# Patient Record
Sex: Male | Born: 1945 | Race: Black or African American | Hispanic: No
Health system: Southern US, Community
[De-identification: ages and names within clinical notes are randomized; demographics above are authoritative.]

---

## 2004-01-21 ENCOUNTER — Encounter: Admission: RE | Admit: 2004-01-21 | Discharge: 2004-01-21 | Payer: Self-pay | Admitting: Family Medicine

## 2004-04-01 ENCOUNTER — Ambulatory Visit (HOSPITAL_COMMUNITY): Admission: RE | Admit: 2004-04-01 | Discharge: 2004-04-01 | Payer: Self-pay | Admitting: Gastroenterology

## 2004-04-01 ENCOUNTER — Encounter (INDEPENDENT_AMBULATORY_CARE_PROVIDER_SITE_OTHER): Payer: Self-pay | Admitting: *Deleted

## 2004-04-13 ENCOUNTER — Encounter: Admission: RE | Admit: 2004-04-13 | Discharge: 2004-04-13 | Payer: Self-pay | Admitting: Nephrology

## 2004-06-08 ENCOUNTER — Ambulatory Visit: Payer: Self-pay | Admitting: Gastroenterology

## 2004-07-07 ENCOUNTER — Ambulatory Visit (HOSPITAL_COMMUNITY): Admission: RE | Admit: 2004-07-07 | Discharge: 2004-07-07 | Payer: Self-pay | Admitting: Gastroenterology

## 2004-07-07 ENCOUNTER — Encounter (INDEPENDENT_AMBULATORY_CARE_PROVIDER_SITE_OTHER): Payer: Self-pay | Admitting: Specialist

## 2004-08-05 ENCOUNTER — Ambulatory Visit: Payer: Self-pay | Admitting: Internal Medicine

## 2004-10-11 ENCOUNTER — Ambulatory Visit: Payer: Self-pay | Admitting: Gastroenterology

## 2005-02-03 ENCOUNTER — Ambulatory Visit: Payer: Self-pay | Admitting: Gastroenterology

## 2005-02-10 ENCOUNTER — Ambulatory Visit: Payer: Self-pay | Admitting: Internal Medicine

## 2005-02-15 ENCOUNTER — Ambulatory Visit: Payer: Self-pay | Admitting: Gastroenterology

## 2005-03-24 ENCOUNTER — Ambulatory Visit: Payer: Self-pay | Admitting: Gastroenterology

## 2005-04-21 ENCOUNTER — Ambulatory Visit: Payer: Self-pay | Admitting: Gastroenterology

## 2005-05-05 ENCOUNTER — Ambulatory Visit: Payer: Self-pay | Admitting: Gastroenterology

## 2005-06-09 ENCOUNTER — Ambulatory Visit: Payer: Self-pay | Admitting: Gastroenterology

## 2005-06-23 ENCOUNTER — Ambulatory Visit: Payer: Self-pay | Admitting: Gastroenterology

## 2005-09-23 ENCOUNTER — Ambulatory Visit: Payer: Self-pay | Admitting: Gastroenterology

## 2005-11-08 ENCOUNTER — Encounter: Admission: RE | Admit: 2005-11-08 | Discharge: 2005-11-08 | Payer: Self-pay | Admitting: Urology

## 2008-04-08 ENCOUNTER — Inpatient Hospital Stay (HOSPITAL_COMMUNITY): Admission: EM | Admit: 2008-04-08 | Discharge: 2008-04-12 | Payer: Self-pay | Admitting: Emergency Medicine

## 2008-04-09 ENCOUNTER — Ambulatory Visit: Payer: Self-pay | Admitting: Vascular Surgery

## 2008-04-09 ENCOUNTER — Encounter (INDEPENDENT_AMBULATORY_CARE_PROVIDER_SITE_OTHER): Payer: Self-pay | Admitting: Internal Medicine

## 2008-04-18 ENCOUNTER — Encounter: Admission: RE | Admit: 2008-04-18 | Discharge: 2008-04-18 | Payer: Self-pay | Admitting: Orthopaedic Surgery

## 2008-07-14 ENCOUNTER — Emergency Department (HOSPITAL_COMMUNITY): Admission: EM | Admit: 2008-07-14 | Discharge: 2008-07-15 | Payer: Self-pay | Admitting: Emergency Medicine

## 2008-07-15 ENCOUNTER — Encounter (INDEPENDENT_AMBULATORY_CARE_PROVIDER_SITE_OTHER): Payer: Self-pay | Admitting: Emergency Medicine

## 2008-07-15 ENCOUNTER — Ambulatory Visit: Payer: Self-pay | Admitting: Surgery

## 2008-07-15 ENCOUNTER — Ambulatory Visit (HOSPITAL_COMMUNITY): Admission: RE | Admit: 2008-07-15 | Discharge: 2008-07-15 | Payer: Self-pay | Admitting: Emergency Medicine

## 2009-08-16 IMAGING — CT CT EXTREM LOW W/O CM*L*
2 of 4 series · 6 of 14 positions shown, 7 images · non-contrast
Comparison: Lower leg radiographs 04/08/2008 and whole body bone
scan 11/08/2005.

CLINICAL DATA: 64-year-old with recent hospitalization for
intravenous antibiotics.  Question deep soft tissue abscess.
History of remote injury and surgery in 6899.

CT OF THE LEFT LOWER LEG WITHOUT CONTRAST
TECHNIQUE: Multidetector CT imaging of the left lower leg was
performed according to the standard protocol without intravenous
contrast. Multiplanar CT image reconstructions were also generated.

[Series 2: left lower leg /bone · axial · 0.39mm/px · z∈[-418,-171]mm · 3 of 199 slices shown, 4 images]
[im 50/199  soft-tissue]
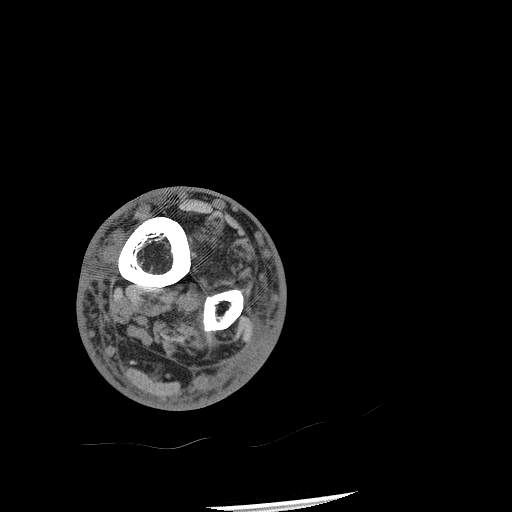
[im 50/199  bone]
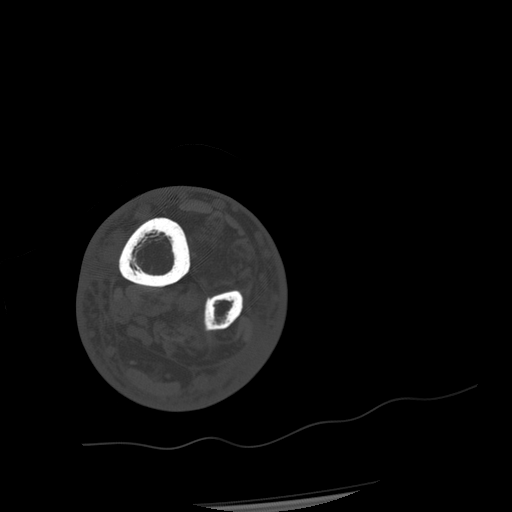
[im 100/199  bone]
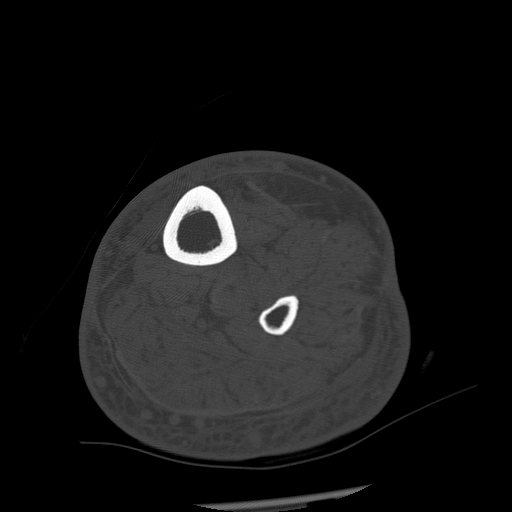
[im 149/199  bone]
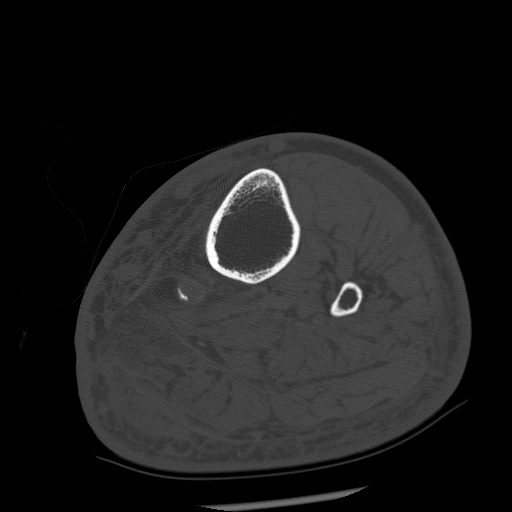

[Series 3: left lower leg/detail · axial · 0.47mm/px · z∈[-416,-171]mm · 3 of 198 slices shown]
[im 50/198  bone]
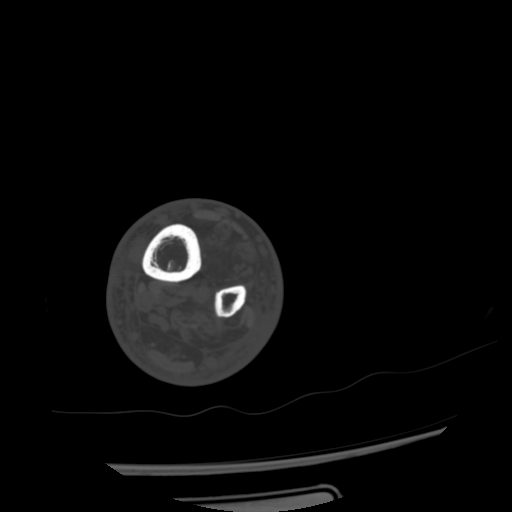
[im 99/198  bone]
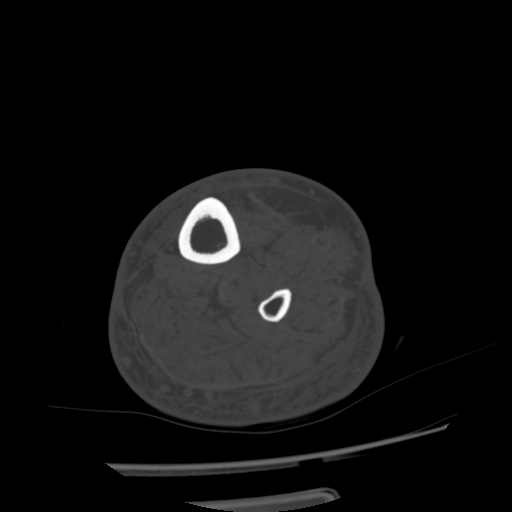
[im 148/198  bone]
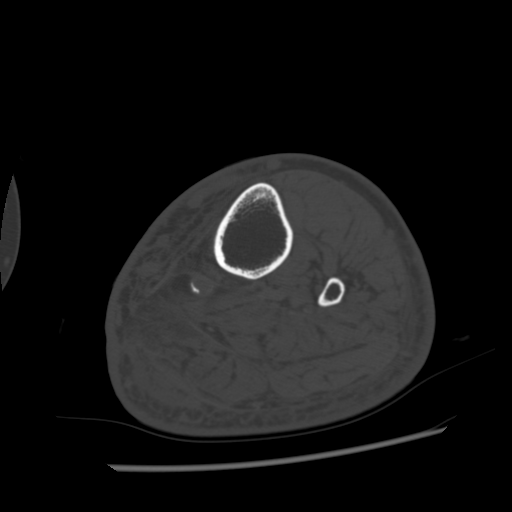

[6 of 14 positions shown; findings below may reference images not displayed]

FINDINGS: The study was ordered and performed without intravenous
contrast.  There is diffuse subcutaneous edema in the left lower
leg compatible cellulitis.  No focal fluid collection is evident.
There is diffuse muscular atrophy.  There are diffuse vascular
calcifications with scattered vascular surgical clips.  No other
foreign bodies are identified. There is no soft tissue emphysema.

There are post-traumatic deformities of the distal tibial and
fibular diaphyses.  Extensive degenerative changes are present at
the knee status post arthrodesis.  There is solid osseous fusion
between the distal femur, patella, proximal tibia and proximal
fibula.  At the ankle, there are degenerative changes with
extensive fragmentation of the calcaneal tuberosity at the Achilles
insertion.  Based on the reformatted images and prior plain films,
this does not appear to be acute, however, clinical correlation is
necessary.  The distal Achilles tendon does appear mildly
thickened, and there is mild surrounding soft tissue edema.
IMPRESSION: 1.  No evidence of deep soft tissue abscess in the left lower leg
on noncontrast CT.  If further evaluation is warranted clinically,
MRI with contrast may be helpful.
2.  Diffuse soft tissue edema most compatible with cellulitis.
3.  No evidence of foreign body or soft tissue gas.
4.  Extensive post-traumatic and postsurgical changes as described
with solid arthrodesis at the knee and a presumed old nonunited
fracture of the calcaneal tuberosity.  Correlate clinically.

## 2010-10-25 LAB — URINALYSIS, ROUTINE W REFLEX MICROSCOPIC
Bilirubin Urine: NEGATIVE
Glucose, UA: 100 mg/dL — AB
Ketones, ur: NEGATIVE mg/dL
Leukocytes, UA: NEGATIVE
Nitrite: NEGATIVE
Protein, ur: 100 mg/dL — AB
Specific Gravity, Urine: 1.022 (ref 1.005–1.030)
Urobilinogen, UA: 1 mg/dL (ref 0.0–1.0)
pH: 5.5 (ref 5.0–8.0)

## 2010-10-25 LAB — DIFFERENTIAL
Basophils Absolute: 0 10*3/uL (ref 0.0–0.1)
Basophils Relative: 1 % (ref 0–1)
Eosinophils Absolute: 0.2 10*3/uL (ref 0.0–0.7)
Eosinophils Relative: 2 % (ref 0–5)
Lymphocytes Relative: 32 % (ref 12–46)
Lymphs Abs: 2.3 10*3/uL (ref 0.7–4.0)
Monocytes Absolute: 0.6 10*3/uL (ref 0.1–1.0)
Monocytes Relative: 9 % (ref 3–12)
Neutro Abs: 4.2 10*3/uL (ref 1.7–7.7)
Neutrophils Relative %: 57 % (ref 43–77)

## 2010-10-25 LAB — COMPREHENSIVE METABOLIC PANEL
ALT: 23 U/L (ref 0–53)
AST: 31 U/L (ref 0–37)
Albumin: 3.3 g/dL — ABNORMAL LOW (ref 3.5–5.2)
Alkaline Phosphatase: 51 U/L (ref 39–117)
BUN: 65 mg/dL — ABNORMAL HIGH (ref 6–23)
CO2: 16 mEq/L — ABNORMAL LOW (ref 19–32)
Calcium: 8.7 mg/dL (ref 8.4–10.5)
Chloride: 110 mEq/L (ref 96–112)
Creatinine, Ser: 4.28 mg/dL — ABNORMAL HIGH (ref 0.4–1.5)
GFR calc Af Amer: 17 mL/min — ABNORMAL LOW (ref >60)
GFR calc non Af Amer: 14 mL/min — ABNORMAL LOW (ref >60)
Glucose, Bld: 125 mg/dL — ABNORMAL HIGH (ref 70–99)
Potassium: 4.8 mEq/L (ref 3.5–5.1)
Sodium: 135 mEq/L (ref 135–145)
Total Bilirubin: 0.3 mg/dL (ref 0.3–1.2)
Total Protein: 6.3 g/dL (ref 6.0–8.3)

## 2010-10-25 LAB — CBC
HCT: 29 % — ABNORMAL LOW (ref 39.0–52.0)
Hemoglobin: 9.8 g/dL — ABNORMAL LOW (ref 13.0–17.0)
MCHC: 33.6 g/dL (ref 30.0–36.0)
MCV: 92.8 fL (ref 78.0–100.0)
Platelets: 235 10*3/uL (ref 150–400)
RBC: 3.13 MIL/uL — ABNORMAL LOW (ref 4.22–5.81)
RDW: 13.5 % (ref 11.5–15.5)
WBC: 7.3 10*3/uL (ref 4.0–10.5)

## 2010-10-25 LAB — URINE MICROSCOPIC-ADD ON

## 2010-10-25 LAB — BRAIN NATRIURETIC PEPTIDE: Pro B Natriuretic peptide (BNP): <30 pg/mL (ref 0.0–100.0)

## 2010-11-23 NOTE — H&P (Signed)
NAME:  Eric Walter, Eric Walter              ACCOUNT NO.:  [id number]   MEDICAL RECORD NO.:  [id number]          PATIENT TYPE:  EMS   LOCATION:  MAJO                         FACILITY:  [facility]   PHYSICIAN:  Ramiro Harvest, MD    DATE OF BIRTH:  09/22/1943   DATE OF ADMISSION:  04/08/2008  DATE OF DISCHARGE:                              HISTORY & PHYSICAL   PRIMARY CARE PHYSICIAN:  Caryn Bee L. Little, M.D. of [facility].   NEPHROLOGIST:  Mindi Slicker. Lowell Guitar, M.D. of [facility].   HISTORY OF PRESENT ILLNESS:  Eric Walter is a 65 year old African  American male with history of chronic renal insufficiency, chronic  hepatitis, status post left knee and ankle fusion secondary to an injury  in the 1980s, who stated that he fell 2 months ago off a ladder and had  a chip wound in his heel for which he saw a Dr. Merleen Milliner for and which  got better.  The patient states that 3 days prior to admission he  started experiencing left calf pain, which has been worsening since  then.  One day prior to admission the patient noted on his left lower  extremity from the knee to the heel he had some edema, erythema, warmth  and also worsening pain on standing.  The patient endorses some  subjective fevers. no chills, no nausea or vomiting, no shortness of  breath, no chest pain, no abdominal pain, no diarrhea, no constipation,  no cough, no melena, no hematemesis, no hematochezia.  No focal  neurological symptoms.  The patient denies any recent long car rides or  recent surgeries.  The patient took some Tylenol with some relief.  The  patient thus presented to the ED.  In the ED the patient was noted to  have a white count of 22.3, hemoglobin of 13.4, platelets of 236,  hematocrit of 40.9.  The patient was afebrile.  We were called to admit  the patient for further evaluation and recommendations.   ALLERGIES:  No known drug allergies.   PAST MEDICAL HISTORY:  1. Glaucoma.  2. Hypothyroidism.  3. Chronic renal insufficiency.  4. Chronic hepatitis with increased fibrosis per biopsy of December      2005.  5. Diverticulosis.  6. Colonic polyps status post polypectomy September of 2005.      Pathology revealed a tubular adenoma, adenomatous polyp and      tubulovillous adenoma with no high-grade dysplasia or malignancy.  7. Prostate cancer.  8. History of left ankle and knee fusion secondary to injury in the      1980s.  9. Status post heel spur surgery.   HOME MEDICATIONS:  1. Lisinopril 10 mg daily.  2. Levothyroxine 100 mcg daily.  3. Travatan 0.004% 1 drop to both eyes nightly.   SOCIAL HISTORY:  The patient is married, lives in [place].  Has a  remote tobacco history, quit in 1998.  Currently no tobacco use.  No  alcohol use.  No IV drug use.  The patient had a 4 year old daughter  who died secondary to an aneurysm.  Has one daughter who  is alive and  healthy and 1 grandson who is alive and healthy.   FAMILY HISTORY:  Father deceased, unknown.  Mother deceased secondary to  a motor vehicle accident.  Has one brother alive at age 53 with  hyperlipidemia.   REVIEW OF SYSTEMS:  As per HPI, otherwise negative.   PHYSICAL EXAM:  Temperature 98.1, blood pressure 105/65, pulse of 84,  respiratory rate 20, satting 97% on room air.  GENERAL: The patient is lying on the gurney in no apparent distress.  HEENT: Normocephalic, atraumatic.  Pupils equal, round and reactive to  light and accommodation.  Extraocular movements intact.  Oropharynx is  clear.  No lesions, no exudates.  NECK: Supple.  No lymphadenopathy.  RESPIRATORY:  Lungs are clear to auscultation bilaterally.  No wheezes,  no crackles, no rhonchi.  CARDIOVASCULAR:  Tachycardiac, regular rhythm.  No murmurs, rubs or  gallops.  ABDOMEN: Soft, nontender, nondistended.  Positive bowel sounds.  No  rebound, no guarding.  EXTREMITIES: No clubbing or cyanosis.  Left lower extremity with  erythema, 2+ edema.   Warmth from the need to the ankle.  Tenderness to  palpation in the calf region.  Negative Homans' sign and no crepitus.  NEUROLOGICAL:  The patient is alert and oriented x3.  Cranial nerves II-  XII are grossly intact.  No focal deficits.   LABORATORIES:  CBC:  White count 22.3, hemoglobin 13.4, platelets 236,  hematocrit 40.9, ANC of 17.3, sodium 137, potassium 4.9, chloride 105,  bicarb 23, BUN 36, creatinine 3.30, a glucose of 136 and a calcium of  9.4.   ASSESSMENT AND PLAN:  Brek Reece is a 65 year old gentleman with  history of chronic renal insufficiency, hypothyroidism, history of left  ankle and left knee fusion secondary to an injury in the 1980s who  presents to the emergency department with left lower extremity edema,  erythema, warmth and pain.  1. Left lower extremity edema/erythema/warmth/pain.  Differential      includes a cellulitis versus a deep vein thrombosis.  We will admit      the patient to a regular bed.  Check a hepatic panel.  Check blood      cultures x2.  Check a UA with cultures and sensitivities.  Check a      Doppler ultrasound of the left lower extremity to rule out deep      vein thrombosis.  Elevate left lower extremity.  Treat empirically      with IV vancomycin and full dose heparin while awaiting Dopplers,      monitor.  2. Chronic renal insufficiency, baseline is unknown.  We will have to      check from his primary care physician on the patient's baseline.      We will check a urinalysis, check a fractional excretion of sodium.      Hydrate gently and follow.  We will hold ACE inhibitor, monitor.  3. Hypothyroidism.  Check a TSH.  Continue levothyroxine 100 mcg p.o.      daily.  4. Glaucoma.  Travatan eye drops.  5. Chronic hepatitis with fibrosis.  6. Diverticulosis.  7. History of prostate cancer.  8. History of left knee and ankle fusion.  9. Colonic polyps status post polypectomy.  10.Prophylaxis:  Protonix for gastrointestinal  prophylaxis.  Heparin      for deep vein thrombosis prophylaxis.   It has been a pleasure taking care of Mr. Deckard Stuber.      Ramiro Harvest, MD  Electronically  Signed     DT/MEDQ  D:  04/08/2008  T:  04/08/2008  Job:  [id number]   cc:   Caryn Bee L. Little, M.D.  Mindi Slicker. Lowell Guitar, M.D.

## 2010-11-26 NOTE — Op Note (Signed)
NAME:  Eric Walter, Eric Walter              ACCOUNT NO.:  [id number]   MEDICAL RECORD NO.:  [id number]          PATIENT TYPE:  AMB   LOCATION:  ENDO                         FACILITY:  [facility]   PHYSICIAN:  Graylin Shiver, M.D.   DATE OF BIRTH:  09/22/1943   DATE OF PROCEDURE:  04/01/2004  DATE OF DISCHARGE:                                 OPERATIVE REPORT   PROCEDURE:  Colonoscopy with polypectomy and biopsy.   INDICATIONS FOR PROCEDURE:  Screening.  Informed consent was obtained after  explanation of the risks of bleeding, infection, and perforation.   PREMEDICATION:  Fentanyl 75 mcg IV, Versed 5 mg IV.   DESCRIPTION OF PROCEDURE:  With the patient in the left lateral decubitus  position, a rectal examination was performed.  No masses were felt.  The  Olympus colonoscope was inserted into the rectum and advanced around the  colon to the cecum.  Upon advancement of the scope, I did see an 8 mm  pedunculated polyp at the splenic flexure area.  This was snared and removed  by snare cautery technique.  The cautery site looked good and the polyp was  retrieved.  In the cecum, there was a fold or raised area that was palpated  and I suspect this is a lipoma.  This was biopsied.  The cecal landmarks  were identified.  The ascending colon showed a small 3 mm polyp biopsied off  with cold forceps.  The transverse colon looked normal.  The descending  colon and sigmoid did reveal diverticulosis.  Also in the descending colon,  there was a 5 mm sessile polyp snared and removed by snare cautery  technique.  The rectum was normal.  He tolerated the procedure well without  complications.   IMPRESSION:  1.  Polyp at the area of the splenic flexure, removed.  2.  Cecal fold versus lipoma.  This was biopsied to make sure that it is not      a polyp.  3.  Ascending colon polyp.  4.  Descending colon polyp.  5.  Diverticulosis of the left colon.   PLAN:  The pathology will be checked.      SFG/MEDQ   D:  04/01/2004  T:  04/02/2004  Job:  [id number]   cc:   Caryn Bee L. Little, M.D.  [address]  [place]  [place] [address]  Fax: [phone]

## 2010-11-26 NOTE — Discharge Summary (Signed)
NAME:  Eric Walter, Eric Walter              ACCOUNT NO.:  [id number]   MEDICAL RECORD NO.:  [id number]          PATIENT TYPE:  INP   LOCATION:  5128                         FACILITY:  [facility]   PHYSICIAN:  Corinna L. Lendell Caprice, MDDATE OF BIRTH:  09/22/1943   DATE OF ADMISSION:  04/08/2008  DATE OF DISCHARGE:  04/12/2008                               DISCHARGE SUMMARY   DISCHARGE DIAGNOSES:  1. Cellulitis of the left leg.  2. Chronic kidney disease.  3. Hypothyroidism.  4. Glaucoma.  5. History of hepatitis.  6. History of prostate cancer.  7. History of left knee and ankle fusion.  8. Diet-controlled diabetes.   DISCHARGE MEDICATIONS:  1. Augmentin 500 mg p.o. b.i.d. until gone.  2. Doxycycline 100 mg p.o. b.i.d. until gone.  3. Dilaudid 2 mg every 4 hours as needed for pain.  4. Continue lisinopril 10 mg a day.  5. Synthroid 100 mcg a day.  6. Travatan ophthalmic drops to both eyes nightly.  7. Aspirin 81 mg a day.   CONDITION:  Stable.   ACTIVITY:  Keep left leg elevated.   Diet should be diabetic.   Follow up with Dr. Clarene Duke in 1-2 weeks.  Follow up with Dr. Lowell Guitar as  previously scheduled.   CONSULTATIONS:  None.   PROCEDURES:  None.   LABORATORY:  Initial white blood cell count was 22,000 with 78%  neutrophils, 13% lymphocytes.  At discharge, his white count is 14,000.  PT/PTT unremarkable.  The rest of his CBC is unremarkable.  Basic  metabolic panel on admission is significant for a glucose of 136, BUN of  36, and creatinine 3.3.  At discharge, his creatinine is 2.81,  hemoglobin A1c 5.8.  TSH 2.054.  Urinalysis showed negative nitrite,  small leukocyte esterase, 3-6 white cells, 0-2 red cells.  Urine culture  grew out less than 85,000 colonies of Proteus mirabilis.   SPECIAL STUDIES/RADIOLOGY:  Doppler of the left leg showed no DVT.  Tib-  fib X-ray showed no fracture acutely.   HISTORY AND HOSPITAL COURSE:  [name] is a 65 year old black male  with a history of  previous left leg trauma and fusion of the knee and  ankle who presented with left leg pain, swelling, and warmth.  He had  fallen off a ladder 2 months prior and suffered a wound to his heel.  He  was afebrile and had normal vital signs on exam.  His leg was swollen,  2+ edema with erythema, warmth from the knee to the ankle  circumferentially, as well as tenderness.  The patient was admitted to  the hospital and started on antibiotics.  His recovery was slow and his  antibiotic coverage was broadened.  On the day of discharge, he had some  remaining erythema and edema but it was much improved from admission.  His white count had decreased, and he was requesting to go home.  DVT  was ruled out.  He was sent home on Augmentin and doxycycline and  recommended elevation and close followup.      Corinna L. Lendell Caprice, MD  Electronically Signed  CLS/MEDQ  D:  05/21/2008  T:  05/22/2008  Job:  [id number]   cc:   Caryn Bee L. Little, M.D.  Mindi Slicker. Lowell Guitar, M.D.

## 2011-04-11 LAB — GLUCOSE, CAPILLARY
Glucose-Capillary: 138 — ABNORMAL HIGH
Glucose-Capillary: 107 — ABNORMAL HIGH
Glucose-Capillary: 130 — ABNORMAL HIGH
Glucose-Capillary: 155 — ABNORMAL HIGH

## 2011-04-11 LAB — URINALYSIS, ROUTINE W REFLEX MICROSCOPIC
Bilirubin Urine: NEGATIVE
Glucose, UA: 500 — AB
Ketones, ur: NEGATIVE
Nitrite: NEGATIVE
Protein, ur: 100 — AB
Specific Gravity, Urine: 1.02
Urobilinogen, UA: 1
pH: 6

## 2011-04-11 LAB — URINE CULTURE: Colony Count: 85000

## 2011-04-11 LAB — CBC
HCT: 40.9
HCT: 36.7 — ABNORMAL LOW
Hemoglobin: 11.7 — ABNORMAL LOW
Hemoglobin: 13.4
Hemoglobin: 12.6 — ABNORMAL LOW
MCHC: 32.7
MCHC: 33.2
MCHC: 32.6
MCHC: 33
MCV: 95.9
MCV: 96.6
MCV: 96.4
MCV: 96.8
Platelets: 236
Platelets: 195
Platelets: 223
RBC: 3.67 — ABNORMAL LOW
RBC: 4.24
RDW: 13.8
RDW: 13.4
RDW: 13.5
RDW: 13.6
WBC: 22.3 — ABNORMAL HIGH
WBC: 14.5 — ABNORMAL HIGH

## 2011-04-11 LAB — BASIC METABOLIC PANEL
BUN: 36 — ABNORMAL HIGH
BUN: 26 — ABNORMAL HIGH
CO2: 21
CO2: 23
CO2: 22
Calcium: 9.4
Calcium: 9
Chloride: 105
Chloride: 107
Creatinine, Ser: 3.3 — ABNORMAL HIGH
Creatinine, Ser: 2.81 — ABNORMAL HIGH
GFR calc Af Amer: 23 — ABNORMAL LOW
GFR calc Af Amer: 24 — ABNORMAL LOW
GFR calc non Af Amer: 19 — ABNORMAL LOW
GFR calc non Af Amer: 23 — ABNORMAL LOW
Glucose, Bld: 136 — ABNORMAL HIGH
Glucose, Bld: 103 — ABNORMAL HIGH
Glucose, Bld: 106 — ABNORMAL HIGH
Potassium: 4.4
Potassium: 4.9
Sodium: 134 — ABNORMAL LOW
Sodium: 137
Sodium: 133 — ABNORMAL LOW

## 2011-04-11 LAB — URINE MICROSCOPIC-ADD ON

## 2011-04-11 LAB — CULTURE, BLOOD (ROUTINE X 2): Culture: NO GROWTH

## 2011-04-11 LAB — HEPATIC FUNCTION PANEL
ALT: 22
AST: 24
Albumin: 3.6
Alkaline Phosphatase: 67
Bilirubin, Direct: 0.1
Indirect Bilirubin: 0.9
Total Bilirubin: 1
Total Protein: 7.2

## 2011-04-11 LAB — DIFFERENTIAL
Basophils Absolute: 0.1
Basophils Absolute: 0.1
Basophils Relative: 0
Basophils Relative: 0
Basophils Relative: 0
Eosinophils Absolute: 0.1
Eosinophils Absolute: 0.1
Eosinophils Absolute: 0.2
Eosinophils Relative: 0
Eosinophils Relative: 1
Lymphocytes Relative: 13
Lymphocytes Relative: 12
Lymphs Abs: 3
Monocytes Absolute: 1.9 — ABNORMAL HIGH
Monocytes Absolute: 2 — ABNORMAL HIGH
Monocytes Relative: 8
Monocytes Relative: 9
Neutro Abs: 17.3 — ABNORMAL HIGH
Neutro Abs: 18.4 — ABNORMAL HIGH
Neutrophils Relative %: 78 — ABNORMAL HIGH

## 2011-04-11 LAB — APTT: aPTT: 28

## 2011-04-11 LAB — HEMOGLOBIN A1C: Mean Plasma Glucose: 120

## 2011-04-11 LAB — PROTIME-INR
INR: 1
Prothrombin Time: 13.4

## 2011-04-11 LAB — TSH: TSH: 2.054

## 2013-10-29 DIAGNOSIS — L03119 Cellulitis of unspecified part of limb: Secondary | ICD-10-CM | POA: Insufficient documentation

## 2013-10-29 DIAGNOSIS — L02419 Cutaneous abscess of limb, unspecified: Secondary | ICD-10-CM | POA: Insufficient documentation

## 2013-10-30 DIAGNOSIS — I10 Essential (primary) hypertension: Secondary | ICD-10-CM | POA: Insufficient documentation

## 2013-10-30 DIAGNOSIS — E119 Type 2 diabetes mellitus without complications: Secondary | ICD-10-CM | POA: Insufficient documentation

## 2013-10-30 DIAGNOSIS — N183 Chronic kidney disease, stage 3 unspecified: Secondary | ICD-10-CM | POA: Insufficient documentation

## 2013-10-30 DIAGNOSIS — E039 Hypothyroidism, unspecified: Secondary | ICD-10-CM | POA: Insufficient documentation

## 2014-01-20 ENCOUNTER — Ambulatory Visit
Admission: RE | Admit: 2014-01-20 | Discharge: 2014-01-20 | Disposition: A | Discharge status: Home / Self Care | Payer: Medicare Other | Source: Ambulatory Visit | Attending: Family Medicine | Admitting: Family Medicine

## 2014-01-20 ENCOUNTER — Other Ambulatory Visit: Payer: Self-pay | Admitting: Family Medicine

## 2014-01-20 DIAGNOSIS — M79642 Pain in left hand: Secondary | ICD-10-CM

## 2014-02-19 DIAGNOSIS — H401111 Primary open-angle glaucoma, right eye, mild stage: Secondary | ICD-10-CM | POA: Insufficient documentation

## 2014-02-19 DIAGNOSIS — IMO0002 Reserved for concepts with insufficient information to code with codable children: Secondary | ICD-10-CM | POA: Insufficient documentation

## 2015-05-20 IMAGING — CR DG HAND 2V*L*
2 series · 2 of 2 positions shown · non-contrast
Comparison: None

CLINICAL DATA: Metacarpal pain with history of trauma 9 days ago

EXAM:
LEFT HAND - 2 VIEW

[x hand pa left]
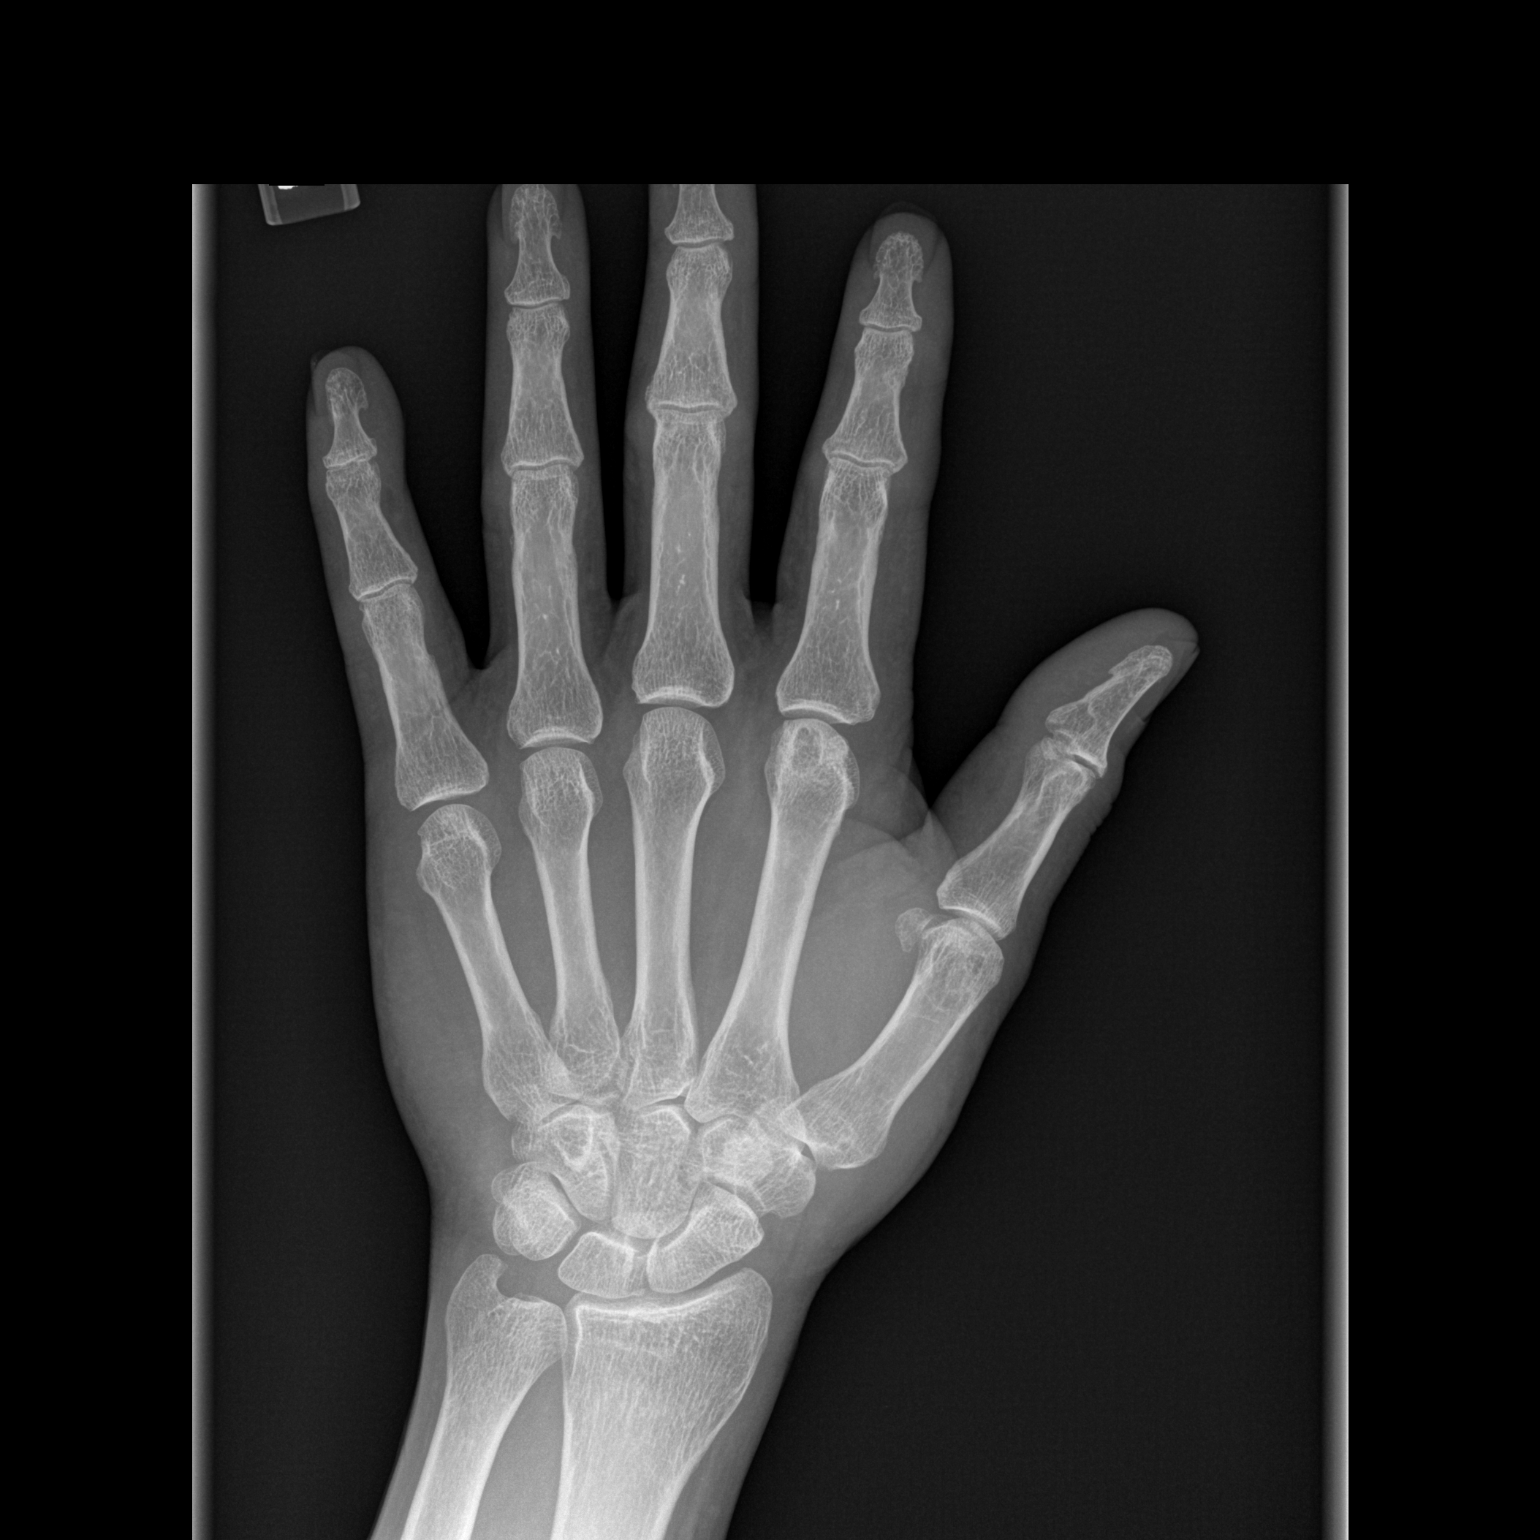

[x hand lat left]
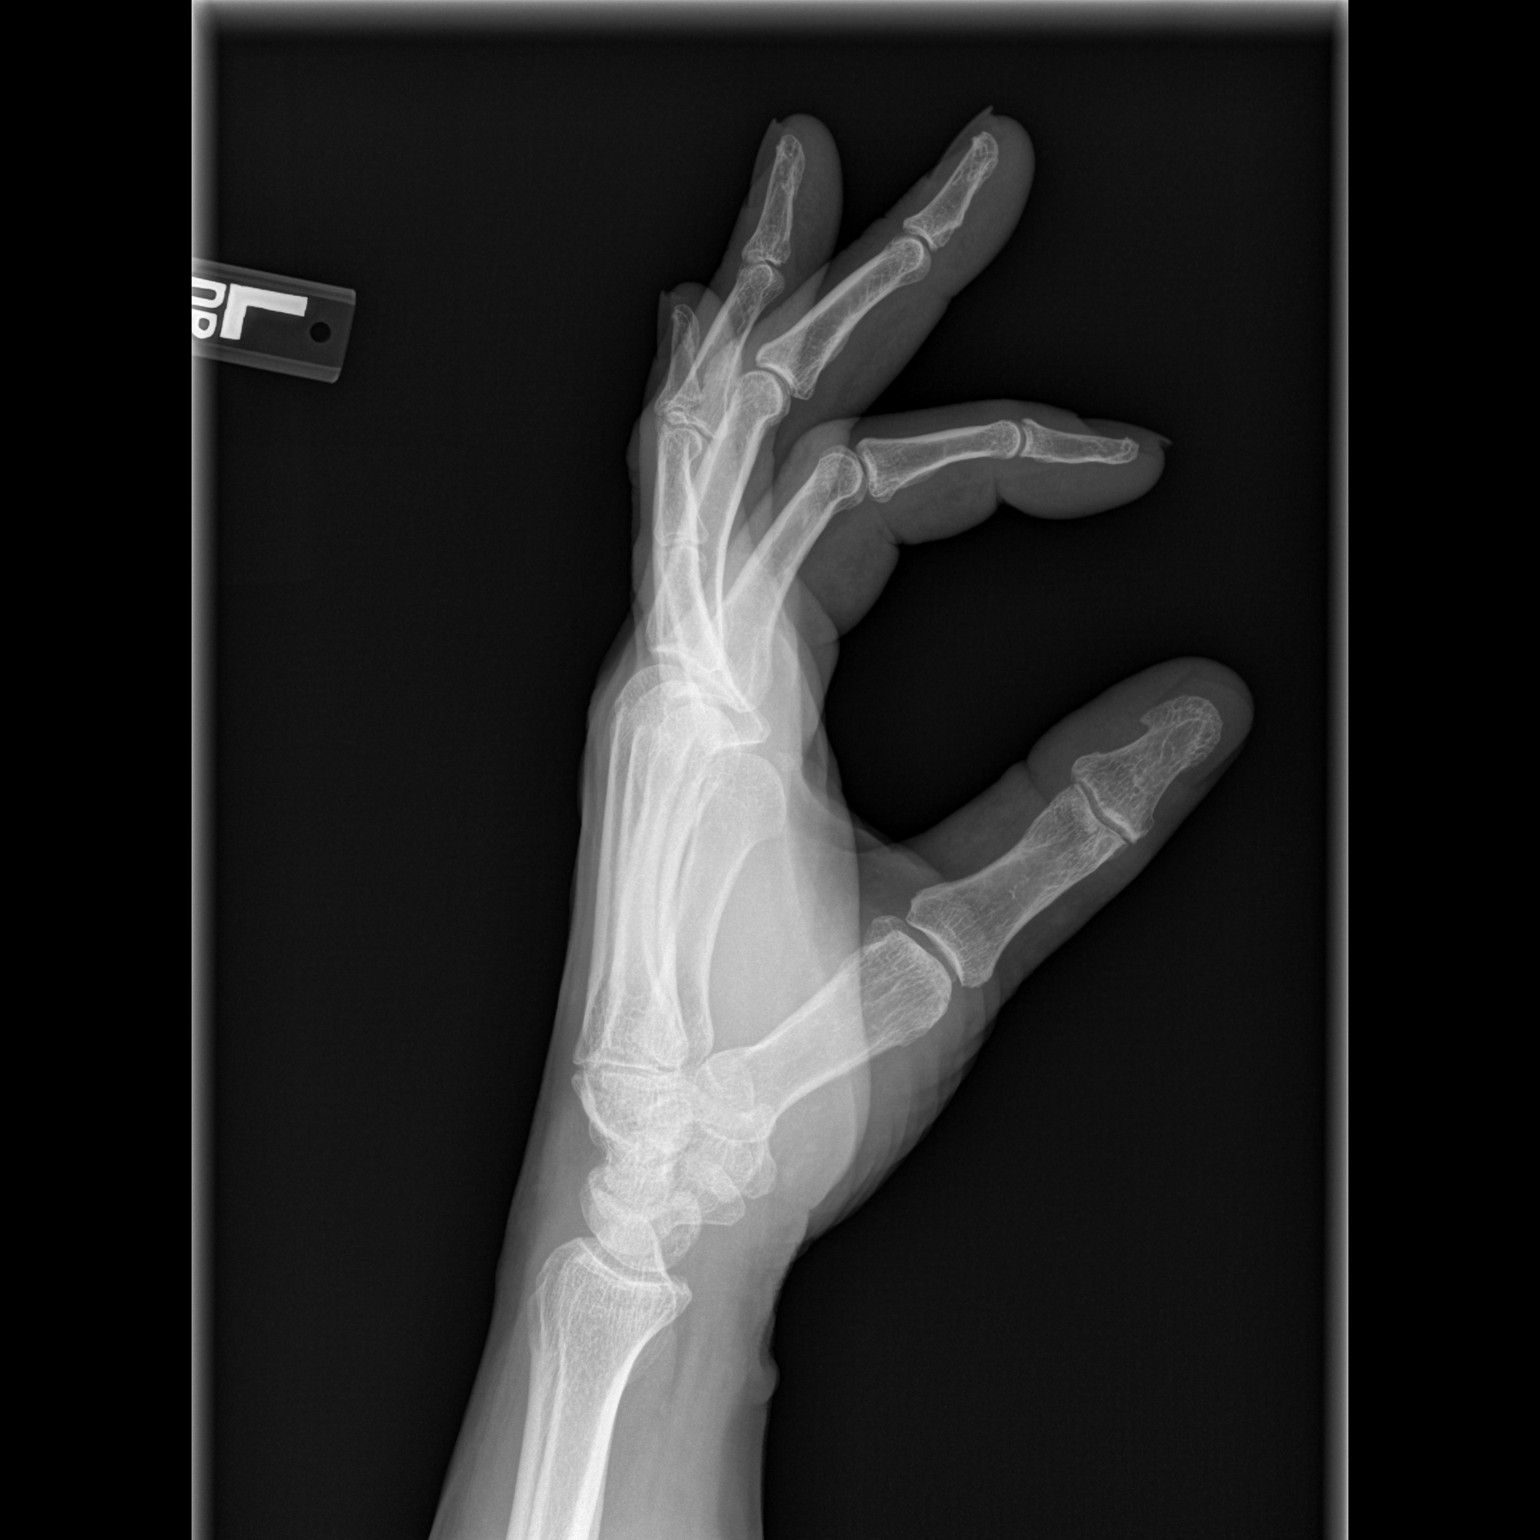

[2 of 2 positions shown; findings below may reference images not displayed]

FINDINGS: The bones are reasonably well mineralized for age. There is mild
interphalangeal joint space narrowing. The metacarpophalangeal
joints are reasonably well-maintained. There is a subcentimeter
lucency in the head of the second metacarpal with sclerotic margins
consistent with a benign bone cyst. The metacarpals otherwise are
normal as are the carpal bones. The soft tissues are unremarkable.
IMPRESSION: There is no acute bony abnormality of the left hand.

## 2015-08-14 DIAGNOSIS — H2513 Age-related nuclear cataract, bilateral: Secondary | ICD-10-CM | POA: Diagnosis not present

## 2015-08-14 DIAGNOSIS — H401123 Primary open-angle glaucoma, left eye, severe stage: Secondary | ICD-10-CM | POA: Diagnosis not present

## 2015-08-14 DIAGNOSIS — H401111 Primary open-angle glaucoma, right eye, mild stage: Secondary | ICD-10-CM | POA: Diagnosis not present

## 2015-08-27 DIAGNOSIS — R0789 Other chest pain: Secondary | ICD-10-CM | POA: Diagnosis not present

## 2015-09-30 DIAGNOSIS — B1920 Unspecified viral hepatitis C without hepatic coma: Secondary | ICD-10-CM | POA: Diagnosis not present

## 2015-09-30 DIAGNOSIS — N184 Chronic kidney disease, stage 4 (severe): Secondary | ICD-10-CM | POA: Diagnosis not present

## 2015-12-25 DIAGNOSIS — H401111 Primary open-angle glaucoma, right eye, mild stage: Secondary | ICD-10-CM | POA: Diagnosis not present

## 2015-12-25 DIAGNOSIS — Z87891 Personal history of nicotine dependence: Secondary | ICD-10-CM | POA: Diagnosis not present

## 2015-12-25 DIAGNOSIS — H401123 Primary open-angle glaucoma, left eye, severe stage: Secondary | ICD-10-CM | POA: Diagnosis not present

## 2016-03-21 DIAGNOSIS — Z23 Encounter for immunization: Secondary | ICD-10-CM | POA: Diagnosis not present

## 2016-04-25 DIAGNOSIS — R351 Nocturia: Secondary | ICD-10-CM | POA: Diagnosis not present

## 2016-04-25 DIAGNOSIS — C61 Malignant neoplasm of prostate: Secondary | ICD-10-CM | POA: Diagnosis not present

## 2016-04-25 DIAGNOSIS — N5201 Erectile dysfunction due to arterial insufficiency: Secondary | ICD-10-CM | POA: Diagnosis not present

## 2016-05-03 DIAGNOSIS — N184 Chronic kidney disease, stage 4 (severe): Secondary | ICD-10-CM | POA: Diagnosis not present

## 2016-05-03 DIAGNOSIS — B1920 Unspecified viral hepatitis C without hepatic coma: Secondary | ICD-10-CM | POA: Diagnosis not present

## 2016-05-12 DIAGNOSIS — M79671 Pain in right foot: Secondary | ICD-10-CM | POA: Diagnosis not present

## 2016-05-23 DIAGNOSIS — M19071 Primary osteoarthritis, right ankle and foot: Secondary | ICD-10-CM | POA: Diagnosis not present

## 2016-05-23 DIAGNOSIS — M722 Plantar fascial fibromatosis: Secondary | ICD-10-CM | POA: Diagnosis not present

## 2016-05-23 DIAGNOSIS — D2371 Other benign neoplasm of skin of right lower limb, including hip: Secondary | ICD-10-CM | POA: Diagnosis not present

## 2016-05-23 DIAGNOSIS — M65871 Other synovitis and tenosynovitis, right ankle and foot: Secondary | ICD-10-CM | POA: Diagnosis not present

## 2016-05-23 DIAGNOSIS — M25571 Pain in right ankle and joints of right foot: Secondary | ICD-10-CM | POA: Diagnosis not present

## 2016-06-06 DIAGNOSIS — N184 Chronic kidney disease, stage 4 (severe): Secondary | ICD-10-CM | POA: Diagnosis not present

## 2016-06-23 DIAGNOSIS — L989 Disorder of the skin and subcutaneous tissue, unspecified: Secondary | ICD-10-CM | POA: Diagnosis not present

## 2016-06-23 DIAGNOSIS — M722 Plantar fascial fibromatosis: Secondary | ICD-10-CM | POA: Diagnosis not present

## 2016-06-23 DIAGNOSIS — D485 Neoplasm of uncertain behavior of skin: Secondary | ICD-10-CM | POA: Diagnosis not present

## 2016-07-14 DIAGNOSIS — L600 Ingrowing nail: Secondary | ICD-10-CM | POA: Diagnosis not present

## 2016-07-18 DIAGNOSIS — H401111 Primary open-angle glaucoma, right eye, mild stage: Secondary | ICD-10-CM | POA: Diagnosis not present

## 2016-07-18 DIAGNOSIS — H401123 Primary open-angle glaucoma, left eye, severe stage: Secondary | ICD-10-CM | POA: Diagnosis not present

## 2016-07-29 DIAGNOSIS — T8189XA Other complications of procedures, not elsewhere classified, initial encounter: Secondary | ICD-10-CM | POA: Diagnosis not present

## 2016-08-18 DIAGNOSIS — M79661 Pain in right lower leg: Secondary | ICD-10-CM | POA: Diagnosis not present

## 2016-08-18 DIAGNOSIS — I890 Lymphedema, not elsewhere classified: Secondary | ICD-10-CM | POA: Diagnosis not present

## 2016-08-18 DIAGNOSIS — T8189XS Other complications of procedures, not elsewhere classified, sequela: Secondary | ICD-10-CM | POA: Diagnosis not present

## 2016-08-23 DIAGNOSIS — M79604 Pain in right leg: Secondary | ICD-10-CM | POA: Diagnosis not present

## 2016-09-15 DIAGNOSIS — L97511 Non-pressure chronic ulcer of other part of right foot limited to breakdown of skin: Secondary | ICD-10-CM | POA: Diagnosis not present

## 2016-09-22 DIAGNOSIS — I10 Essential (primary) hypertension: Secondary | ICD-10-CM | POA: Diagnosis not present

## 2016-09-22 DIAGNOSIS — E039 Hypothyroidism, unspecified: Secondary | ICD-10-CM | POA: Diagnosis not present

## 2016-09-22 DIAGNOSIS — N189 Chronic kidney disease, unspecified: Secondary | ICD-10-CM | POA: Diagnosis not present

## 2016-09-22 DIAGNOSIS — E1121 Type 2 diabetes mellitus with diabetic nephropathy: Secondary | ICD-10-CM | POA: Diagnosis not present

## 2016-11-07 DIAGNOSIS — B1920 Unspecified viral hepatitis C without hepatic coma: Secondary | ICD-10-CM | POA: Diagnosis not present

## 2016-11-07 DIAGNOSIS — N184 Chronic kidney disease, stage 4 (severe): Secondary | ICD-10-CM | POA: Diagnosis not present

## 2016-11-17 DIAGNOSIS — B1920 Unspecified viral hepatitis C without hepatic coma: Secondary | ICD-10-CM | POA: Diagnosis not present

## 2016-11-17 DIAGNOSIS — W290XXA Contact with powered kitchen appliance, initial encounter: Secondary | ICD-10-CM | POA: Diagnosis not present

## 2016-11-17 DIAGNOSIS — E039 Hypothyroidism, unspecified: Secondary | ICD-10-CM | POA: Diagnosis not present

## 2016-11-17 DIAGNOSIS — Z981 Arthrodesis status: Secondary | ICD-10-CM | POA: Diagnosis not present

## 2016-11-17 DIAGNOSIS — E119 Type 2 diabetes mellitus without complications: Secondary | ICD-10-CM | POA: Diagnosis not present

## 2016-11-17 DIAGNOSIS — C61 Malignant neoplasm of prostate: Secondary | ICD-10-CM | POA: Diagnosis not present

## 2016-11-17 DIAGNOSIS — S61210A Laceration without foreign body of right index finger without damage to nail, initial encounter: Secondary | ICD-10-CM | POA: Diagnosis not present

## 2016-11-17 DIAGNOSIS — Z23 Encounter for immunization: Secondary | ICD-10-CM | POA: Diagnosis not present

## 2016-11-17 DIAGNOSIS — I10 Essential (primary) hypertension: Secondary | ICD-10-CM | POA: Diagnosis not present

## 2016-11-17 DIAGNOSIS — N289 Disorder of kidney and ureter, unspecified: Secondary | ICD-10-CM | POA: Diagnosis not present

## 2016-11-17 DIAGNOSIS — H409 Unspecified glaucoma: Secondary | ICD-10-CM | POA: Diagnosis not present

## 2017-01-23 DIAGNOSIS — H401111 Primary open-angle glaucoma, right eye, mild stage: Secondary | ICD-10-CM | POA: Diagnosis not present

## 2017-01-23 DIAGNOSIS — H401123 Primary open-angle glaucoma, left eye, severe stage: Secondary | ICD-10-CM | POA: Diagnosis not present

## 2017-03-09 DIAGNOSIS — R05 Cough: Secondary | ICD-10-CM | POA: Diagnosis not present

## 2017-03-09 DIAGNOSIS — E1121 Type 2 diabetes mellitus with diabetic nephropathy: Secondary | ICD-10-CM | POA: Diagnosis not present

## 2017-03-22 DIAGNOSIS — Z6829 Body mass index (BMI) 29.0-29.9, adult: Secondary | ICD-10-CM | POA: Diagnosis not present

## 2017-03-22 DIAGNOSIS — E663 Overweight: Secondary | ICD-10-CM | POA: Diagnosis not present

## 2017-03-22 DIAGNOSIS — R05 Cough: Secondary | ICD-10-CM | POA: Diagnosis not present

## 2017-04-10 DIAGNOSIS — Z23 Encounter for immunization: Secondary | ICD-10-CM | POA: Diagnosis not present

## 2017-04-24 DIAGNOSIS — C61 Malignant neoplasm of prostate: Secondary | ICD-10-CM | POA: Diagnosis not present

## 2017-04-24 DIAGNOSIS — N5201 Erectile dysfunction due to arterial insufficiency: Secondary | ICD-10-CM | POA: Diagnosis not present

## 2017-04-24 DIAGNOSIS — R351 Nocturia: Secondary | ICD-10-CM | POA: Diagnosis not present

## 2017-05-01 DIAGNOSIS — N184 Chronic kidney disease, stage 4 (severe): Secondary | ICD-10-CM | POA: Diagnosis not present

## 2017-05-01 DIAGNOSIS — B1920 Unspecified viral hepatitis C without hepatic coma: Secondary | ICD-10-CM | POA: Diagnosis not present

## 2017-05-29 DIAGNOSIS — H401123 Primary open-angle glaucoma, left eye, severe stage: Secondary | ICD-10-CM | POA: Diagnosis not present

## 2017-05-29 DIAGNOSIS — H401111 Primary open-angle glaucoma, right eye, mild stage: Secondary | ICD-10-CM | POA: Diagnosis not present

## 2017-05-29 DIAGNOSIS — H2513 Age-related nuclear cataract, bilateral: Secondary | ICD-10-CM | POA: Diagnosis not present

## 2017-07-18 DIAGNOSIS — H401123 Primary open-angle glaucoma, left eye, severe stage: Secondary | ICD-10-CM | POA: Diagnosis not present

## 2017-08-25 DIAGNOSIS — H401111 Primary open-angle glaucoma, right eye, mild stage: Secondary | ICD-10-CM | POA: Diagnosis not present

## 2017-09-25 DIAGNOSIS — H401123 Primary open-angle glaucoma, left eye, severe stage: Secondary | ICD-10-CM | POA: Diagnosis not present

## 2017-09-25 DIAGNOSIS — H2513 Age-related nuclear cataract, bilateral: Secondary | ICD-10-CM | POA: Diagnosis not present

## 2017-09-25 DIAGNOSIS — H401111 Primary open-angle glaucoma, right eye, mild stage: Secondary | ICD-10-CM | POA: Diagnosis not present

## 2017-12-29 DIAGNOSIS — E039 Hypothyroidism, unspecified: Secondary | ICD-10-CM | POA: Diagnosis not present

## 2017-12-29 DIAGNOSIS — E1121 Type 2 diabetes mellitus with diabetic nephropathy: Secondary | ICD-10-CM | POA: Diagnosis not present

## 2017-12-29 DIAGNOSIS — N189 Chronic kidney disease, unspecified: Secondary | ICD-10-CM | POA: Diagnosis not present

## 2017-12-29 DIAGNOSIS — I10 Essential (primary) hypertension: Secondary | ICD-10-CM | POA: Diagnosis not present

## 2018-01-01 DIAGNOSIS — H401111 Primary open-angle glaucoma, right eye, mild stage: Secondary | ICD-10-CM | POA: Diagnosis not present

## 2018-01-01 DIAGNOSIS — H2513 Age-related nuclear cataract, bilateral: Secondary | ICD-10-CM | POA: Diagnosis not present

## 2018-01-01 DIAGNOSIS — H401123 Primary open-angle glaucoma, left eye, severe stage: Secondary | ICD-10-CM | POA: Diagnosis not present

## 2018-01-16 DIAGNOSIS — B1920 Unspecified viral hepatitis C without hepatic coma: Secondary | ICD-10-CM | POA: Diagnosis not present

## 2018-01-16 DIAGNOSIS — N184 Chronic kidney disease, stage 4 (severe): Secondary | ICD-10-CM | POA: Diagnosis not present

## 2018-01-16 DIAGNOSIS — N2581 Secondary hyperparathyroidism of renal origin: Secondary | ICD-10-CM | POA: Diagnosis not present

## 2018-02-20 DIAGNOSIS — I10 Essential (primary) hypertension: Secondary | ICD-10-CM | POA: Diagnosis not present

## 2018-02-20 DIAGNOSIS — E1121 Type 2 diabetes mellitus with diabetic nephropathy: Secondary | ICD-10-CM | POA: Diagnosis not present

## 2018-02-20 DIAGNOSIS — N189 Chronic kidney disease, unspecified: Secondary | ICD-10-CM | POA: Diagnosis not present

## 2018-02-20 DIAGNOSIS — E039 Hypothyroidism, unspecified: Secondary | ICD-10-CM | POA: Diagnosis not present

## 2018-04-02 DIAGNOSIS — E039 Hypothyroidism, unspecified: Secondary | ICD-10-CM | POA: Diagnosis not present

## 2018-04-08 DIAGNOSIS — Z79899 Other long term (current) drug therapy: Secondary | ICD-10-CM | POA: Diagnosis not present

## 2018-04-08 DIAGNOSIS — B1920 Unspecified viral hepatitis C without hepatic coma: Secondary | ICD-10-CM | POA: Diagnosis not present

## 2018-04-08 DIAGNOSIS — E039 Hypothyroidism, unspecified: Secondary | ICD-10-CM | POA: Diagnosis not present

## 2018-04-08 DIAGNOSIS — Z881 Allergy status to other antibiotic agents status: Secondary | ICD-10-CM | POA: Diagnosis not present

## 2018-04-08 DIAGNOSIS — N3000 Acute cystitis without hematuria: Secondary | ICD-10-CM | POA: Diagnosis not present

## 2018-04-08 DIAGNOSIS — Z886 Allergy status to analgesic agent status: Secondary | ICD-10-CM | POA: Diagnosis not present

## 2018-04-08 DIAGNOSIS — Z7982 Long term (current) use of aspirin: Secondary | ICD-10-CM | POA: Diagnosis not present

## 2018-04-08 DIAGNOSIS — E119 Type 2 diabetes mellitus without complications: Secondary | ICD-10-CM | POA: Diagnosis not present

## 2018-04-08 DIAGNOSIS — Z87891 Personal history of nicotine dependence: Secondary | ICD-10-CM | POA: Diagnosis not present

## 2018-04-08 DIAGNOSIS — R001 Bradycardia, unspecified: Secondary | ICD-10-CM | POA: Diagnosis not present

## 2018-04-08 DIAGNOSIS — I10 Essential (primary) hypertension: Secondary | ICD-10-CM | POA: Diagnosis not present

## 2018-04-09 DIAGNOSIS — I10 Essential (primary) hypertension: Secondary | ICD-10-CM | POA: Diagnosis not present

## 2018-04-30 DIAGNOSIS — Z8546 Personal history of malignant neoplasm of prostate: Secondary | ICD-10-CM | POA: Diagnosis not present

## 2018-04-30 DIAGNOSIS — N5201 Erectile dysfunction due to arterial insufficiency: Secondary | ICD-10-CM | POA: Diagnosis not present

## 2018-04-30 DIAGNOSIS — R351 Nocturia: Secondary | ICD-10-CM | POA: Diagnosis not present

## 2018-05-07 DIAGNOSIS — H02834 Dermatochalasis of left upper eyelid: Secondary | ICD-10-CM | POA: Diagnosis not present

## 2018-05-07 DIAGNOSIS — H02831 Dermatochalasis of right upper eyelid: Secondary | ICD-10-CM | POA: Diagnosis not present

## 2018-05-07 DIAGNOSIS — Z881 Allergy status to other antibiotic agents status: Secondary | ICD-10-CM | POA: Diagnosis not present

## 2018-05-07 DIAGNOSIS — H2513 Age-related nuclear cataract, bilateral: Secondary | ICD-10-CM | POA: Diagnosis not present

## 2018-05-07 DIAGNOSIS — H401123 Primary open-angle glaucoma, left eye, severe stage: Secondary | ICD-10-CM | POA: Diagnosis not present

## 2018-05-07 DIAGNOSIS — R234 Changes in skin texture: Secondary | ICD-10-CM | POA: Diagnosis not present

## 2018-05-07 DIAGNOSIS — H401111 Primary open-angle glaucoma, right eye, mild stage: Secondary | ICD-10-CM | POA: Diagnosis not present

## 2018-07-18 DIAGNOSIS — M25512 Pain in left shoulder: Secondary | ICD-10-CM | POA: Diagnosis not present

## 2018-08-08 DIAGNOSIS — H47393 Other disorders of optic disc, bilateral: Secondary | ICD-10-CM | POA: Diagnosis not present

## 2018-08-08 DIAGNOSIS — Z881 Allergy status to other antibiotic agents status: Secondary | ICD-10-CM | POA: Diagnosis not present

## 2018-08-08 DIAGNOSIS — H401111 Primary open-angle glaucoma, right eye, mild stage: Secondary | ICD-10-CM | POA: Diagnosis not present

## 2018-08-08 DIAGNOSIS — H2511 Age-related nuclear cataract, right eye: Secondary | ICD-10-CM | POA: Diagnosis not present

## 2018-08-08 DIAGNOSIS — H25812 Combined forms of age-related cataract, left eye: Secondary | ICD-10-CM | POA: Diagnosis not present

## 2018-08-08 DIAGNOSIS — H401123 Primary open-angle glaucoma, left eye, severe stage: Secondary | ICD-10-CM | POA: Diagnosis not present

## 2018-08-08 DIAGNOSIS — H02831 Dermatochalasis of right upper eyelid: Secondary | ICD-10-CM | POA: Diagnosis not present

## 2018-08-08 DIAGNOSIS — H02834 Dermatochalasis of left upper eyelid: Secondary | ICD-10-CM | POA: Diagnosis not present

## 2018-08-08 DIAGNOSIS — H2513 Age-related nuclear cataract, bilateral: Secondary | ICD-10-CM | POA: Diagnosis not present

## 2018-08-23 DIAGNOSIS — H401123 Primary open-angle glaucoma, left eye, severe stage: Secondary | ICD-10-CM | POA: Diagnosis not present

## 2018-08-23 DIAGNOSIS — H2513 Age-related nuclear cataract, bilateral: Secondary | ICD-10-CM | POA: Diagnosis not present

## 2018-08-30 DIAGNOSIS — I10 Essential (primary) hypertension: Secondary | ICD-10-CM | POA: Diagnosis not present

## 2018-08-30 DIAGNOSIS — H401123 Primary open-angle glaucoma, left eye, severe stage: Secondary | ICD-10-CM | POA: Diagnosis not present

## 2018-08-30 DIAGNOSIS — H25042 Posterior subcapsular polar age-related cataract, left eye: Secondary | ICD-10-CM | POA: Diagnosis not present

## 2018-08-30 DIAGNOSIS — H25812 Combined forms of age-related cataract, left eye: Secondary | ICD-10-CM | POA: Diagnosis not present

## 2018-08-30 DIAGNOSIS — H2512 Age-related nuclear cataract, left eye: Secondary | ICD-10-CM | POA: Diagnosis not present

## 2018-08-31 DIAGNOSIS — H401111 Primary open-angle glaucoma, right eye, mild stage: Secondary | ICD-10-CM | POA: Diagnosis not present

## 2018-08-31 DIAGNOSIS — Z48810 Encounter for surgical aftercare following surgery on the sense organs: Secondary | ICD-10-CM | POA: Diagnosis not present

## 2018-08-31 DIAGNOSIS — Z79899 Other long term (current) drug therapy: Secondary | ICD-10-CM | POA: Diagnosis not present

## 2018-08-31 DIAGNOSIS — Z961 Presence of intraocular lens: Secondary | ICD-10-CM | POA: Diagnosis not present

## 2018-08-31 DIAGNOSIS — H401123 Primary open-angle glaucoma, left eye, severe stage: Secondary | ICD-10-CM | POA: Diagnosis not present

## 2018-09-12 DIAGNOSIS — H401111 Primary open-angle glaucoma, right eye, mild stage: Secondary | ICD-10-CM | POA: Diagnosis not present

## 2018-09-12 DIAGNOSIS — Z961 Presence of intraocular lens: Secondary | ICD-10-CM | POA: Diagnosis not present

## 2018-09-12 DIAGNOSIS — H401123 Primary open-angle glaucoma, left eye, severe stage: Secondary | ICD-10-CM | POA: Diagnosis not present

## 2018-10-01 DIAGNOSIS — I129 Hypertensive chronic kidney disease with stage 1 through stage 4 chronic kidney disease, or unspecified chronic kidney disease: Secondary | ICD-10-CM | POA: Diagnosis not present

## 2018-10-01 DIAGNOSIS — N184 Chronic kidney disease, stage 4 (severe): Secondary | ICD-10-CM | POA: Diagnosis not present

## 2018-10-01 DIAGNOSIS — D649 Anemia, unspecified: Secondary | ICD-10-CM | POA: Diagnosis not present

## 2018-10-29 DIAGNOSIS — H401111 Primary open-angle glaucoma, right eye, mild stage: Secondary | ICD-10-CM | POA: Diagnosis not present

## 2018-10-29 DIAGNOSIS — Z961 Presence of intraocular lens: Secondary | ICD-10-CM | POA: Diagnosis not present

## 2018-10-29 DIAGNOSIS — Z9842 Cataract extraction status, left eye: Secondary | ICD-10-CM | POA: Diagnosis not present

## 2018-11-26 DIAGNOSIS — H401111 Primary open-angle glaucoma, right eye, mild stage: Secondary | ICD-10-CM | POA: Diagnosis not present

## 2018-11-26 DIAGNOSIS — Z79899 Other long term (current) drug therapy: Secondary | ICD-10-CM | POA: Diagnosis not present

## 2018-11-26 DIAGNOSIS — H401123 Primary open-angle glaucoma, left eye, severe stage: Secondary | ICD-10-CM | POA: Diagnosis not present

## 2018-11-26 DIAGNOSIS — Z9889 Other specified postprocedural states: Secondary | ICD-10-CM | POA: Diagnosis not present

## 2018-11-26 DIAGNOSIS — H2511 Age-related nuclear cataract, right eye: Secondary | ICD-10-CM | POA: Diagnosis not present

## 2019-01-21 DIAGNOSIS — N184 Chronic kidney disease, stage 4 (severe): Secondary | ICD-10-CM | POA: Diagnosis not present

## 2019-01-28 DIAGNOSIS — D649 Anemia, unspecified: Secondary | ICD-10-CM | POA: Diagnosis not present

## 2019-01-28 DIAGNOSIS — N2581 Secondary hyperparathyroidism of renal origin: Secondary | ICD-10-CM | POA: Diagnosis not present

## 2019-01-28 DIAGNOSIS — I129 Hypertensive chronic kidney disease with stage 1 through stage 4 chronic kidney disease, or unspecified chronic kidney disease: Secondary | ICD-10-CM | POA: Diagnosis not present

## 2019-01-28 DIAGNOSIS — N184 Chronic kidney disease, stage 4 (severe): Secondary | ICD-10-CM | POA: Diagnosis not present

## 2019-03-22 DIAGNOSIS — E1121 Type 2 diabetes mellitus with diabetic nephropathy: Secondary | ICD-10-CM | POA: Diagnosis not present

## 2019-03-22 DIAGNOSIS — N189 Chronic kidney disease, unspecified: Secondary | ICD-10-CM | POA: Diagnosis not present

## 2019-03-22 DIAGNOSIS — I10 Essential (primary) hypertension: Secondary | ICD-10-CM | POA: Diagnosis not present

## 2019-03-22 DIAGNOSIS — Z8546 Personal history of malignant neoplasm of prostate: Secondary | ICD-10-CM | POA: Diagnosis not present

## 2019-04-15 DIAGNOSIS — N184 Chronic kidney disease, stage 4 (severe): Secondary | ICD-10-CM | POA: Diagnosis not present

## 2019-04-23 DIAGNOSIS — N184 Chronic kidney disease, stage 4 (severe): Secondary | ICD-10-CM | POA: Diagnosis not present

## 2019-04-23 DIAGNOSIS — N2581 Secondary hyperparathyroidism of renal origin: Secondary | ICD-10-CM | POA: Diagnosis not present

## 2019-04-23 DIAGNOSIS — D631 Anemia in chronic kidney disease: Secondary | ICD-10-CM | POA: Diagnosis not present

## 2019-04-23 DIAGNOSIS — I129 Hypertensive chronic kidney disease with stage 1 through stage 4 chronic kidney disease, or unspecified chronic kidney disease: Secondary | ICD-10-CM | POA: Diagnosis not present

## 2019-04-29 DIAGNOSIS — Z8546 Personal history of malignant neoplasm of prostate: Secondary | ICD-10-CM | POA: Diagnosis not present

## 2019-05-06 DIAGNOSIS — N5201 Erectile dysfunction due to arterial insufficiency: Secondary | ICD-10-CM | POA: Diagnosis not present

## 2019-05-06 DIAGNOSIS — R3129 Other microscopic hematuria: Secondary | ICD-10-CM | POA: Diagnosis not present

## 2019-05-06 DIAGNOSIS — R351 Nocturia: Secondary | ICD-10-CM | POA: Diagnosis not present

## 2019-05-06 DIAGNOSIS — Z8546 Personal history of malignant neoplasm of prostate: Secondary | ICD-10-CM | POA: Diagnosis not present

## 2019-06-21 DIAGNOSIS — R3915 Urgency of urination: Secondary | ICD-10-CM | POA: Diagnosis not present

## 2019-06-21 DIAGNOSIS — R351 Nocturia: Secondary | ICD-10-CM | POA: Diagnosis not present

## 2019-06-21 DIAGNOSIS — R311 Benign essential microscopic hematuria: Secondary | ICD-10-CM | POA: Diagnosis not present

## 2019-07-01 DIAGNOSIS — L02212 Cutaneous abscess of back [any part, except buttock]: Secondary | ICD-10-CM | POA: Diagnosis not present

## 2019-07-01 DIAGNOSIS — L0291 Cutaneous abscess, unspecified: Secondary | ICD-10-CM | POA: Diagnosis not present

## 2019-07-15 DIAGNOSIS — N184 Chronic kidney disease, stage 4 (severe): Secondary | ICD-10-CM | POA: Diagnosis not present

## 2019-07-18 DIAGNOSIS — Z03818 Encounter for observation for suspected exposure to other biological agents ruled out: Secondary | ICD-10-CM | POA: Diagnosis not present

## 2019-07-22 DIAGNOSIS — B349 Viral infection, unspecified: Secondary | ICD-10-CM | POA: Diagnosis not present

## 2019-07-23 DIAGNOSIS — Z03818 Encounter for observation for suspected exposure to other biological agents ruled out: Secondary | ICD-10-CM | POA: Diagnosis not present

## 2019-07-23 DIAGNOSIS — B349 Viral infection, unspecified: Secondary | ICD-10-CM | POA: Diagnosis not present

## 2019-08-02 DIAGNOSIS — I129 Hypertensive chronic kidney disease with stage 1 through stage 4 chronic kidney disease, or unspecified chronic kidney disease: Secondary | ICD-10-CM | POA: Diagnosis not present

## 2019-08-02 DIAGNOSIS — N2581 Secondary hyperparathyroidism of renal origin: Secondary | ICD-10-CM | POA: Diagnosis not present

## 2019-08-02 DIAGNOSIS — D631 Anemia in chronic kidney disease: Secondary | ICD-10-CM | POA: Diagnosis not present

## 2019-08-02 DIAGNOSIS — N184 Chronic kidney disease, stage 4 (severe): Secondary | ICD-10-CM | POA: Diagnosis not present

## 2019-08-13 ENCOUNTER — Ambulatory Visit: Payer: Self-pay

## 2019-10-07 DIAGNOSIS — M79671 Pain in right foot: Secondary | ICD-10-CM | POA: Diagnosis not present

## 2019-10-07 DIAGNOSIS — M79672 Pain in left foot: Secondary | ICD-10-CM | POA: Diagnosis not present

## 2019-10-07 DIAGNOSIS — L84 Corns and callosities: Secondary | ICD-10-CM | POA: Diagnosis not present

## 2019-10-07 DIAGNOSIS — B351 Tinea unguium: Secondary | ICD-10-CM | POA: Diagnosis not present

## 2019-11-04 DIAGNOSIS — N189 Chronic kidney disease, unspecified: Secondary | ICD-10-CM | POA: Diagnosis not present

## 2019-11-04 DIAGNOSIS — M79671 Pain in right foot: Secondary | ICD-10-CM | POA: Diagnosis not present

## 2019-11-04 DIAGNOSIS — M109 Gout, unspecified: Secondary | ICD-10-CM | POA: Diagnosis not present

## 2019-11-18 DIAGNOSIS — N184 Chronic kidney disease, stage 4 (severe): Secondary | ICD-10-CM | POA: Diagnosis not present

## 2019-11-25 DIAGNOSIS — I129 Hypertensive chronic kidney disease with stage 1 through stage 4 chronic kidney disease, or unspecified chronic kidney disease: Secondary | ICD-10-CM | POA: Diagnosis not present

## 2019-11-25 DIAGNOSIS — N184 Chronic kidney disease, stage 4 (severe): Secondary | ICD-10-CM | POA: Diagnosis not present

## 2019-11-25 DIAGNOSIS — N2581 Secondary hyperparathyroidism of renal origin: Secondary | ICD-10-CM | POA: Diagnosis not present

## 2019-11-25 DIAGNOSIS — D631 Anemia in chronic kidney disease: Secondary | ICD-10-CM | POA: Diagnosis not present

## 2019-12-30 DIAGNOSIS — H2511 Age-related nuclear cataract, right eye: Secondary | ICD-10-CM | POA: Diagnosis not present

## 2019-12-30 DIAGNOSIS — H401111 Primary open-angle glaucoma, right eye, mild stage: Secondary | ICD-10-CM | POA: Diagnosis not present

## 2019-12-30 DIAGNOSIS — H401113 Primary open-angle glaucoma, right eye, severe stage: Secondary | ICD-10-CM | POA: Diagnosis not present

## 2019-12-30 DIAGNOSIS — Z79899 Other long term (current) drug therapy: Secondary | ICD-10-CM | POA: Diagnosis not present

## 2019-12-30 DIAGNOSIS — H401123 Primary open-angle glaucoma, left eye, severe stage: Secondary | ICD-10-CM | POA: Diagnosis not present

## 2020-01-07 DIAGNOSIS — R0789 Other chest pain: Secondary | ICD-10-CM | POA: Diagnosis not present

## 2020-01-07 DIAGNOSIS — R05 Cough: Secondary | ICD-10-CM | POA: Diagnosis not present

## 2020-01-08 DIAGNOSIS — I517 Cardiomegaly: Secondary | ICD-10-CM | POA: Diagnosis not present

## 2020-01-08 DIAGNOSIS — I10 Essential (primary) hypertension: Secondary | ICD-10-CM | POA: Diagnosis not present

## 2020-01-08 DIAGNOSIS — R0602 Shortness of breath: Secondary | ICD-10-CM | POA: Diagnosis not present

## 2020-01-09 ENCOUNTER — Encounter: Payer: Self-pay | Admitting: Podiatry

## 2020-01-09 ENCOUNTER — Other Ambulatory Visit: Payer: Self-pay

## 2020-01-09 ENCOUNTER — Ambulatory Visit (INDEPENDENT_AMBULATORY_CARE_PROVIDER_SITE_OTHER): Payer: Medicare Other

## 2020-01-09 ENCOUNTER — Ambulatory Visit: Payer: Medicare Other | Admitting: Podiatry

## 2020-01-09 DIAGNOSIS — M79675 Pain in left toe(s): Secondary | ICD-10-CM

## 2020-01-09 DIAGNOSIS — M205X1 Other deformities of toe(s) (acquired), right foot: Secondary | ICD-10-CM

## 2020-01-09 DIAGNOSIS — E119 Type 2 diabetes mellitus without complications: Secondary | ICD-10-CM | POA: Diagnosis not present

## 2020-01-09 DIAGNOSIS — M778 Other enthesopathies, not elsewhere classified: Secondary | ICD-10-CM | POA: Diagnosis not present

## 2020-01-09 DIAGNOSIS — M2170 Unequal limb length (acquired), unspecified site: Secondary | ICD-10-CM

## 2020-01-09 DIAGNOSIS — B351 Tinea unguium: Secondary | ICD-10-CM

## 2020-01-09 DIAGNOSIS — M79674 Pain in right toe(s): Secondary | ICD-10-CM

## 2020-01-09 DIAGNOSIS — I872 Venous insufficiency (chronic) (peripheral): Secondary | ICD-10-CM

## 2020-01-09 NOTE — Progress Notes (Signed)
  Subjective:  Patient ID: Eric Walter, male    DOB: 1945-08-17,  MRN: [id number]  Chief Complaint  Patient presents with  . Foot Pain    right foot, comes and goes  . Nail Problem    thick painful toenails that the patient cannot trim himself, left knee has been fused    74 y.o. male presents with the above complaint. History confirmed with patient.  Primarily complains of burning aching pain and the first MPJ on the right side, as well as a burning tingling pain in the bottom of the feet when he walks barefoot.  He has difficulty walking as he has a knee fusion on the left side following a traumatic accident.  He also complains of thickened, elongated, painful toenails that he cannot trim himself.  Objective:  Physical Exam: warm, good capillary refill, no trophic changes or ulcerative lesions, normal DP and PT pulses, normal sensory exam and venous stasis dermatitis noted. Left Foot: Minimal ankle motion with limited dorsiflexion, hallux limitus present, thickened, elongated, painful toenails x5  Right Foot: Limited ankle dorsiflexion but better than left side, hallux limitus present, pain with range of motion of the first MTPJ, mild pain along the sesamoids plantarly, negative Tinel's the tarsal tunnel and porta pedis, elongated, thickened, painful toenails x5   Significant discrepancy present measurements are as follows: Total left limb length 78 cm, tibial segment 43 cm Total right limb length 90 cm, tibial segment length 51 cm  Radiographs: X-ray of the right foot: degenerative changes of the Right first metatarsophalangeal joint, mild and mostly along the medial side, mild degenerative changes along the sesamoid complex in the lateral view  Assessment:   1. Capsulitis of foot, right   2. Hallux limitus, right   3. Acquired unequal limb length   4. Onychomycosis   5. Pain due to onychomycosis of toenails of both feet   6. Type 2 diabetes mellitus without complication,  without long-term current use of insulin (HCC)   7. Venous (peripheral) insufficiency      Plan:  Patient was evaluated and treated and all questions answered.  Acquired limb length discrepancy, Capsulitis and Onychomycosis -Educated on etiology -XR reviewed with patient -Injection delivered to the painful joint Procedure: Joint Injection Location: Right first metatarsal phalangeal joint Skin Prep: Betadine. Injectate: 0.5 cc 1% lidocaine plain, 0.5 cc 0.5% Marcaine plain, 0.5 cc Kenalog 10 0.5 cc dexamethasone phosphate 4 mg/mL. Disposition: Patient tolerated procedure well. Injection site dressed with a band-aid.  -Nails palliatively debrided secondary to pain  Procedure: Nail Debridement Rationale: Patient meets criteria for routine foot care due to pain and onychomycosis Type of Debridement: manual, sharp debridement. Instrumentation: Nail nipper, rotary burr. Number of Nails: 10  Acquired limb length discrepancy -He is a significant limit discrepancy totaling approximately 11 to 12 cm, with the left leg shorter than the right leg.  He walks with a cane and has difficulty ambulating.  I observed him walking with an antalgic gait, which sways from side to side due to the limb length discrepancy and this transfers into the hips and back.  He could benefit from a full-length significant shoe lift with a custom shoe on the left side, and a prescription was written and he will be sent to [facility] for this.    Return in about 3 months (around 04/10/2020) for nail trim.

## 2020-02-04 DIAGNOSIS — H401123 Primary open-angle glaucoma, left eye, severe stage: Secondary | ICD-10-CM | POA: Diagnosis not present

## 2020-02-04 DIAGNOSIS — H2511 Age-related nuclear cataract, right eye: Secondary | ICD-10-CM | POA: Diagnosis not present

## 2020-02-29 DIAGNOSIS — Z041 Encounter for examination and observation following transport accident: Secondary | ICD-10-CM | POA: Diagnosis not present

## 2020-02-29 DIAGNOSIS — S82852B Displaced trimalleolar fracture of left lower leg, initial encounter for open fracture type I or II: Secondary | ICD-10-CM | POA: Diagnosis not present

## 2020-02-29 DIAGNOSIS — E119 Type 2 diabetes mellitus without complications: Secondary | ICD-10-CM | POA: Diagnosis not present

## 2020-02-29 DIAGNOSIS — R109 Unspecified abdominal pain: Secondary | ICD-10-CM | POA: Diagnosis not present

## 2020-02-29 DIAGNOSIS — S82852C Displaced trimalleolar fracture of left lower leg, initial encounter for open fracture type IIIA, IIIB, or IIIC: Secondary | ICD-10-CM | POA: Diagnosis not present

## 2020-02-29 DIAGNOSIS — R079 Chest pain, unspecified: Secondary | ICD-10-CM | POA: Diagnosis not present

## 2020-02-29 DIAGNOSIS — S82872A Displaced pilon fracture of left tibia, initial encounter for closed fracture: Secondary | ICD-10-CM | POA: Diagnosis not present

## 2020-02-29 DIAGNOSIS — S82892B Other fracture of left lower leg, initial encounter for open fracture type I or II: Secondary | ICD-10-CM | POA: Diagnosis not present

## 2020-02-29 DIAGNOSIS — Z20822 Contact with and (suspected) exposure to covid-19: Secondary | ICD-10-CM | POA: Diagnosis not present

## 2020-02-29 DIAGNOSIS — M542 Cervicalgia: Secondary | ICD-10-CM | POA: Diagnosis not present

## 2020-02-29 DIAGNOSIS — D62 Acute posthemorrhagic anemia: Secondary | ICD-10-CM | POA: Diagnosis not present

## 2020-02-29 DIAGNOSIS — S0990XA Unspecified injury of head, initial encounter: Secondary | ICD-10-CM | POA: Diagnosis not present

## 2020-02-29 DIAGNOSIS — S82202A Unspecified fracture of shaft of left tibia, initial encounter for closed fracture: Secondary | ICD-10-CM | POA: Diagnosis not present

## 2020-02-29 DIAGNOSIS — E872 Acidosis: Secondary | ICD-10-CM | POA: Diagnosis not present

## 2020-02-29 DIAGNOSIS — E039 Hypothyroidism, unspecified: Secondary | ICD-10-CM | POA: Diagnosis not present

## 2020-02-29 DIAGNOSIS — S62309A Unspecified fracture of unspecified metacarpal bone, initial encounter for closed fracture: Secondary | ICD-10-CM | POA: Diagnosis not present

## 2020-02-29 DIAGNOSIS — H4010X0 Unspecified open-angle glaucoma, stage unspecified: Secondary | ICD-10-CM | POA: Diagnosis not present

## 2020-02-29 DIAGNOSIS — M545 Low back pain: Secondary | ICD-10-CM | POA: Diagnosis not present

## 2020-02-29 DIAGNOSIS — S2231XA Fracture of one rib, right side, initial encounter for closed fracture: Secondary | ICD-10-CM | POA: Diagnosis not present

## 2020-02-29 DIAGNOSIS — S93432A Sprain of tibiofibular ligament of left ankle, initial encounter: Secondary | ICD-10-CM | POA: Diagnosis not present

## 2020-02-29 DIAGNOSIS — S62323A Displaced fracture of shaft of third metacarpal bone, left hand, initial encounter for closed fracture: Secondary | ICD-10-CM | POA: Diagnosis not present

## 2020-02-29 DIAGNOSIS — S82852F Displaced trimalleolar fracture of left lower leg, subsequent encounter for open fracture type IIIA, IIIB, or IIIC with routine healing: Secondary | ICD-10-CM | POA: Diagnosis not present

## 2020-02-29 DIAGNOSIS — S62311A Displaced fracture of base of second metacarpal bone. left hand, initial encounter for closed fracture: Secondary | ICD-10-CM | POA: Diagnosis not present

## 2020-02-29 DIAGNOSIS — S82892A Other fracture of left lower leg, initial encounter for closed fracture: Secondary | ICD-10-CM | POA: Diagnosis not present

## 2020-02-29 DIAGNOSIS — Z9889 Other specified postprocedural states: Secondary | ICD-10-CM | POA: Diagnosis not present

## 2020-02-29 DIAGNOSIS — N183 Chronic kidney disease, stage 3 unspecified: Secondary | ICD-10-CM | POA: Diagnosis not present

## 2020-03-11 DIAGNOSIS — S82899A Other fracture of unspecified lower leg, initial encounter for closed fracture: Secondary | ICD-10-CM | POA: Diagnosis not present

## 2020-03-11 DIAGNOSIS — D62 Acute posthemorrhagic anemia: Secondary | ICD-10-CM | POA: Diagnosis not present

## 2020-03-11 DIAGNOSIS — E039 Hypothyroidism, unspecified: Secondary | ICD-10-CM | POA: Diagnosis not present

## 2020-03-12 DIAGNOSIS — E039 Hypothyroidism, unspecified: Secondary | ICD-10-CM | POA: Diagnosis not present

## 2020-03-12 DIAGNOSIS — H409 Unspecified glaucoma: Secondary | ICD-10-CM | POA: Diagnosis not present

## 2020-03-12 DIAGNOSIS — H269 Unspecified cataract: Secondary | ICD-10-CM | POA: Diagnosis not present

## 2020-03-12 DIAGNOSIS — E1122 Type 2 diabetes mellitus with diabetic chronic kidney disease: Secondary | ICD-10-CM | POA: Diagnosis not present

## 2020-03-18 DIAGNOSIS — E1122 Type 2 diabetes mellitus with diabetic chronic kidney disease: Secondary | ICD-10-CM | POA: Diagnosis not present

## 2020-03-18 DIAGNOSIS — N183 Chronic kidney disease, stage 3 unspecified: Secondary | ICD-10-CM | POA: Diagnosis not present

## 2020-03-18 DIAGNOSIS — H269 Unspecified cataract: Secondary | ICD-10-CM | POA: Diagnosis not present

## 2020-03-18 DIAGNOSIS — L0390 Cellulitis, unspecified: Secondary | ICD-10-CM | POA: Diagnosis not present

## 2020-04-10 DEATH — deceased

## 2020-04-17 ENCOUNTER — Ambulatory Visit: Payer: Medicare Other | Admitting: Podiatry
